# Patient Record
Sex: Female | Born: 1995 | Race: Asian | Hispanic: No | Marital: Single | Smoking: Former smoker
Health system: Southern US, Community
[De-identification: ages and names within clinical notes are randomized; demographics above are authoritative.]

## PROBLEM LIST (undated history)

## (undated) DIAGNOSIS — Z789 Other specified health status: Secondary | ICD-10-CM

## (undated) DIAGNOSIS — R002 Palpitations: Secondary | ICD-10-CM

## (undated) HISTORY — PX: NO PAST SURGERIES: SHX2092

## (undated) HISTORY — PX: TUBAL LIGATION: SHX77

## (undated) HISTORY — DX: Palpitations: R00.2

---
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
## 2014-05-11 ENCOUNTER — Emergency Department (HOSPITAL_COMMUNITY): Payer: No Typology Code available for payment source

## 2014-05-11 ENCOUNTER — Emergency Department (HOSPITAL_COMMUNITY)
Admission: EM | Admit: 2014-05-11 | Discharge: 2014-05-11 | Disposition: A | Discharge status: Home / Self Care | Payer: No Typology Code available for payment source | Attending: Emergency Medicine | Admitting: Emergency Medicine

## 2014-05-11 ENCOUNTER — Encounter (HOSPITAL_COMMUNITY): Payer: Self-pay | Admitting: Emergency Medicine

## 2014-05-11 DIAGNOSIS — S1091XA Abrasion of unspecified part of neck, initial encounter: Secondary | ICD-10-CM | POA: Diagnosis not present

## 2014-05-11 DIAGNOSIS — Y92410 Unspecified street and highway as the place of occurrence of the external cause: Secondary | ICD-10-CM | POA: Diagnosis not present

## 2014-05-11 DIAGNOSIS — R52 Pain, unspecified: Secondary | ICD-10-CM

## 2014-05-11 DIAGNOSIS — Z720 Tobacco use: Secondary | ICD-10-CM | POA: Insufficient documentation

## 2014-05-11 DIAGNOSIS — S161XXA Strain of muscle, fascia and tendon at neck level, initial encounter: Secondary | ICD-10-CM | POA: Diagnosis not present

## 2014-05-11 DIAGNOSIS — Y998 Other external cause status: Secondary | ICD-10-CM | POA: Insufficient documentation

## 2014-05-11 DIAGNOSIS — S3992XA Unspecified injury of lower back, initial encounter: Secondary | ICD-10-CM | POA: Insufficient documentation

## 2014-05-11 DIAGNOSIS — S20211A Contusion of right front wall of thorax, initial encounter: Secondary | ICD-10-CM | POA: Diagnosis not present

## 2014-05-11 DIAGNOSIS — S199XXA Unspecified injury of neck, initial encounter: Secondary | ICD-10-CM | POA: Diagnosis present

## 2014-05-11 DIAGNOSIS — Y9389 Activity, other specified: Secondary | ICD-10-CM | POA: Insufficient documentation

## 2014-05-11 MED ORDER — IBUPROFEN 600 MG PO TABS: 600.0000 mg | ORAL_TABLET | Freq: Four times a day (QID) | ORAL | Status: DC | PRN

## 2014-05-11 MED ORDER — IBUPROFEN 200 MG PO TABS
600.0000 mg | ORAL_TABLET | Freq: Once | ORAL | Status: AC
  Administered 2014-05-11: 600 mg via ORAL
  Filled 2014-05-11: qty 3

## 2014-05-11 NOTE — ED Notes (Signed)
Patient transported to X-ray 
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
## 2014-05-11 NOTE — ED Notes (Signed)
Patient transported to CT 

## 2014-05-11 NOTE — Discharge Instructions (Signed)
Cervical Sprain A cervical sprain is when the tissues (ligaments) that hold the neck bones in place stretch or tear. HOME CARE   Put ice on the injured area.  Put ice in a plastic bag.  Place a towel between your skin and the bag.  Leave the ice on for 15-20 minutes, 3-4 times a day.  You may have been given a collar to wear. This collar keeps your neck from moving while you heal.  Do not take the collar off unless told by your doctor.  If you have long hair, keep it outside of the collar.  Ask your doctor before changing the position of your collar. You may need to change its position over time to make it more comfortable.  If you are allowed to take off the collar for cleaning or bathing, follow your doctor's instructions on how to do it safely.  Keep your collar clean by wiping it with mild soap and water. Dry it completely. If the collar has removable pads, remove them every 1-2 days to hand wash them with soap and water. Allow them to air dry. They should be dry before you wear them in the collar.  Do not drive while wearing the collar.  Only take medicine as told by your doctor.  Keep all doctor visits as told.  Keep all physical therapy visits as told.  Adjust your work station so that you have good posture while you work.  Avoid positions and activities that make your problems worse.  Warm up and stretch before being active. GET HELP IF:  Your pain is not controlled with medicine.  You cannot take less pain medicine over time as planned.  Your activity level does not improve as expected. GET HELP RIGHT AWAY IF:   You are bleeding.  Your stomach is upset.  You have an allergic reaction to your medicine.  You develop new problems that you cannot explain.  You lose feeling (become numb) or you cannot move any part of your body (paralysis).  You have tingling or weakness in any part of your body.  Your symptoms get worse. Symptoms include:  Pain,  soreness, stiffness, puffiness (swelling), or a burning feeling in your neck.  Pain when your neck is touched.  Shoulder or upper back pain.  Limited ability to move your neck.  Headache.  Dizziness.  Your hands or arms feel week, lose feeling, or tingle.  Muscle spasms.  Difficulty swallowing or chewing. MAKE SURE YOU:   Understand these instructions.  Will watch your condition.  Will get help right away if you are not doing well or get worse. Document Released: 11/14/2007 Document Revised: 01/28/2013 Document Reviewed: 12/03/2012 [employer] Patient Information 2015 [employer]. This information is not intended to replace advice given to you by your health care provider. Make sure you discuss any questions you have with your health care provider.  
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
## 2016-03-15 LAB — OB RESULTS CONSOLE ABO/RH: RH Type: POSITIVE

## 2016-03-15 LAB — OB RESULTS CONSOLE GC/CHLAMYDIA
Chlamydia: NEGATIVE
GC PROBE AMP, GENITAL: NEGATIVE

## 2016-03-15 LAB — OB RESULTS CONSOLE HEPATITIS B SURFACE ANTIGEN: Hepatitis B Surface Ag: NEGATIVE

## 2016-03-15 LAB — OB RESULTS CONSOLE HIV ANTIBODY (ROUTINE TESTING): HIV: NONREACTIVE

## 2016-03-15 LAB — OB RESULTS CONSOLE RUBELLA ANTIBODY, IGM: RUBELLA: IMMUNE

## 2016-03-15 LAB — OB RESULTS CONSOLE GBS: STREP GROUP B AG: NEGATIVE
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
## 2016-07-23 LAB — OB RESULTS CONSOLE GBS: GBS: NEGATIVE

## 2016-08-10 ENCOUNTER — Emergency Department (HOSPITAL_COMMUNITY)
Admission: EM | Admit: 2016-08-10 | Discharge: 2016-08-10 | Disposition: A | Discharge status: Home / Self Care | Payer: Medicaid Other | Attending: Emergency Medicine | Admitting: Emergency Medicine

## 2016-08-10 ENCOUNTER — Encounter (HOSPITAL_COMMUNITY): Payer: Self-pay | Admitting: Emergency Medicine

## 2016-08-10 DIAGNOSIS — O99713 Diseases of the skin and subcutaneous tissue complicating pregnancy, third trimester: Secondary | ICD-10-CM | POA: Insufficient documentation

## 2016-08-10 DIAGNOSIS — Z87891 Personal history of nicotine dependence: Secondary | ICD-10-CM | POA: Diagnosis not present

## 2016-08-10 DIAGNOSIS — L299 Pruritus, unspecified: Secondary | ICD-10-CM | POA: Diagnosis not present

## 2016-08-10 DIAGNOSIS — Z3A39 39 weeks gestation of pregnancy: Secondary | ICD-10-CM | POA: Insufficient documentation

## 2016-08-10 LAB — POC URINE PREG, ED: PREG TEST UR: POSITIVE — AB

## 2016-08-10 LAB — URINALYSIS, ROUTINE W REFLEX MICROSCOPIC
Bacteria, UA: NONE SEEN
Bilirubin Urine: NEGATIVE
GLUCOSE, UA: NEGATIVE mg/dL
HGB URINE DIPSTICK: NEGATIVE
Ketones, ur: NEGATIVE mg/dL
Nitrite: NEGATIVE
Protein, ur: NEGATIVE mg/dL
SPECIFIC GRAVITY, URINE: 1.011 (ref 1.005–1.030)
pH: 7 (ref 5.0–8.0)

## 2016-08-10 LAB — CBC WITH DIFFERENTIAL/PLATELET
BASOS PCT: 0 %
Basophils Absolute: 0 10*3/uL (ref 0.0–0.1)
EOS ABS: 0.3 10*3/uL (ref 0.0–0.7)
Eosinophils Relative: 3 %
HCT: 31.6 % — ABNORMAL LOW (ref 36.0–46.0)
Hemoglobin: 10.4 g/dL — ABNORMAL LOW (ref 12.0–15.0)
LYMPHS ABS: 2.4 10*3/uL (ref 0.7–4.0)
Lymphocytes Relative: 24 %
MCH: 24.4 pg — AB (ref 26.0–34.0)
MCHC: 32.9 g/dL (ref 30.0–36.0)
MCV: 74 fL — ABNORMAL LOW (ref 78.0–100.0)
MONO ABS: 0.9 10*3/uL (ref 0.1–1.0)
MONOS PCT: 8 %
Neutro Abs: 6.7 10*3/uL (ref 1.7–7.7)
Neutrophils Relative %: 65 %
Platelets: 327 10*3/uL (ref 150–400)
RBC: 4.27 MIL/uL (ref 3.87–5.11)
RDW: 14.4 % (ref 11.5–15.5)
WBC: 10.2 10*3/uL (ref 4.0–10.5)

## 2016-08-10 LAB — COMPREHENSIVE METABOLIC PANEL
ALT: 7 U/L — AB (ref 14–54)
ANION GAP: 11 (ref 5–15)
AST: 16 U/L (ref 15–41)
Albumin: 2.8 g/dL — ABNORMAL LOW (ref 3.5–5.0)
Alkaline Phosphatase: 141 U/L — ABNORMAL HIGH (ref 38–126)
BUN: 9 mg/dL (ref 6–20)
CALCIUM: 9.2 mg/dL (ref 8.9–10.3)
CHLORIDE: 102 mmol/L (ref 101–111)
CO2: 23 mmol/L (ref 22–32)
CREATININE: 0.67 mg/dL (ref 0.44–1.00)
GFR calc Af Amer: >60 mL/min (ref >60)
Glucose, Bld: 96 mg/dL (ref 65–99)
Potassium: 3.9 mmol/L (ref 3.5–5.1)
Sodium: 136 mmol/L (ref 135–145)
Total Bilirubin: 0.4 mg/dL (ref 0.3–1.2)
Total Protein: 6.2 g/dL — ABNORMAL LOW (ref 6.5–8.1)

## 2016-08-10 MED ORDER — DIPHENHYDRAMINE HCL 25 MG PO TABS: 25.0000 mg | ORAL_TABLET | Freq: Four times a day (QID) | ORAL | 0 refills | Status: DC

## 2016-08-10 NOTE — Discharge Instructions (Signed)
The rash and itching on your hands and feet is most likely due to a virus. We have done lab work tonight to be sure there is no other problem. Some of the test will not be back for 24 hours. Follow up with your doctor. Have them check the labs that we did. You can also find the results in "My chart". If the results are abnormal someone will call you.  Take the Benadryl as needed for itching.

## 2016-08-10 NOTE — ED Triage Notes (Signed)
Pt presents to ED for assessment of rash to hands and feet, intermittent for "some time".  Pt states hot water makes it worse.  Pt states worse at night.  Itchy, sometimes painful.  Shows up between toes on feet, and on thumb and palms on hand.

## 2016-08-10 NOTE — ED Provider Notes (Signed)
[facility]-EMERGENCY DEPT Provider Note    By signing my name below, I, Earmon Phoenix, attest that this documentation has been prepared under the direction and in the presence of [facility], [place]. Electronically Signed: Earmon Phoenix, ED Scribe. 08/10/16. 10:55 PM.    History   Chief Complaint Chief Complaint  Patient presents with  . Rash   The history is provided by the patient and medical records. No language interpreter was used.    Cassandra Pacheco is a 21 y.o. female at [redacted] weeks gestation who presents to the Emergency Department complaining of itching that started more than a week ago. She reports the itching is between her fingers, on the palms of her hands and on the bottom of her feet. She reports having a rash that comes and goes on her hands. She has not done anything to treat the symptoms. There are no modifying factors noted. She denies mouth lesions, otalgia, fever, chills, nausea, vomiting, sore throat, difficulty breathing or swallowing, congestion. She denies any contractions today. She does not know the name of her OB but states she receives prenatal care at [facility].   History reviewed. No pertinent past medical history.  There are no active problems to display for this patient.   History reviewed. No pertinent surgical history.  OB History    Gravida Para Term Preterm AB Living   1             SAB TAB Ectopic Multiple Live Births                   Home Medications    Prior to Admission medications   Medication Sig Start Date End Date Taking? Authorizing Provider  Prenatal Vit-Fe Phos-FA-Omega (VITAFOL GUMMIES) 3.33-0.333-34.8 MG CHEW Chew 3 tablets by mouth daily. 06/20/16  Yes Historical Provider, MD  diphenhydrAMINE (BENADRYL) 25 MG tablet Take 1 tablet (25 mg total) by mouth every 6 (six) hours. 08/10/16   Liona Wengert Orlene Och, NP    Family History Family History  Problem Relation Age of Onset  . Hypertension Other   . Diabetes  Other     Social History Social History  Substance Use Topics  . Smoking status: Former Smoker    Packs/day: 0.25  . Smokeless tobacco: Never Used  . Alcohol use Yes     Allergies   Patient has no known allergies.   Review of Systems Review of Systems  Constitutional: Negative for chills and fever.  HENT: Negative for congestion, ear pain, mouth sores, sore throat and trouble swallowing.   Eyes: Negative for visual disturbance.  Respiratory: Negative for cough and shortness of breath.   Cardiovascular: Negative for chest pain.  Gastrointestinal: Negative for abdominal pain, nausea and vomiting.       Gravid @ [redacted] weeks gestation.   Genitourinary: Negative for difficulty urinating, dysuria, flank pain, frequency, vaginal bleeding and vaginal discharge.  Musculoskeletal: Negative for back pain and gait problem.  Skin: Positive for rash.  Neurological: Negative for dizziness, weakness, light-headedness and headaches.  Psychiatric/Behavioral: Negative for confusion.     Physical Exam Updated Vital Signs BP 132/84 (BP Location: Left Arm)   Pulse 96   Temp 98.2 F (36.8 C) (Oral)   Resp 17   SpO2 100%   Physical Exam  Constitutional: She is oriented to person, place, and time. She appears well-developed and well-nourished.  HENT:  No mouth lesions.  Eyes: EOM are normal.  Neck: Neck supple.  Cardiovascular: Normal rate.  Pulmonary/Chest: Effort normal.  Abdominal: Soft. There is no tenderness.  Gravid abdomen c/w dates  Musculoskeletal: Normal range of motion.  Small raised red rash to palms of hands. No rash noted to feet but patient complains of itching.   Neurological: She is alert and oriented to person, place, and time.  Skin: Skin is warm and dry. Rash noted.  Itchy, red bumps on bilateral palms of hands and a few areas noted to soles of feet bilaterally.  Psychiatric: She has a normal mood and affect. Her behavior is normal.  Nursing note and vitals  reviewed.    ED Treatments / Results  DIAGNOSTIC STUDIES: Oxygen Saturation is 100% on RA, normal by my interpretation.   COORDINATION OF CARE: 7:41 PM- Will check fetal heart tones. Pt verbalizes understanding and agrees to plan.  7:49 PM- [facility] who advised NST and lab work to check for cholestasis.  7:59 PM- Spoke with rapid response OB nurse who is going to come assess patient.  8:12 PM- Placed pt on fetal monitor with fetal heart rate of 150.  Rapid response RN "Olegario MessierKathy" here to assess the patient.  Reactive tracing with occasional contraction. Patient not feeling the contractions that are visible on the monitor.  Dr. Vergie LivingPickens notified of tracing and discussed plan of care. Patient can be d/c home with Benadryl and f/u with her OB or return for worsening symptoms.  Medications - No data to display  Labs Radiology No results found.  Procedures Procedures (including critical care time) Labs, EFM, observation.  Medications Ordered in ED Medications - No data to display   Initial Impression / Assessment and Plan / ED Course  I have reviewed the triage vital signs and the nursing notes.  Pertinent lab results that were available during my care of the patient were reviewed by me and considered in my medical decision making (see chart for details).   Patient with rash and itching. Possible cholestasis but doubt due to normal liver enzymes.Bile acid testing will result in two days. Possible coxsackie, rash most likely viral in nature. Doubt PUPPS. No signs of secondary infection. Discharged with symptomatic treatment, Benadryl and Follow up with OB in 2-3 days. Return precautions discussed. Pt is safe for discharge at this time.  I personally performed the services described in this documentation, which was scribed in my presence. The recorded information has been reviewed and is accurate.  Final Clinical Impressions(s) / ED Diagnoses   Final  diagnoses:  Pregnancy pruritus, third trimester    New Prescriptions Discharge Medication List as of 08/10/2016 10:56 PM    START taking these medications   Details  diphenhydrAMINE (BENADRYL) 25 MG tablet Take 1 tablet (25 mg total) by mouth every 6 (six) hours., Starting Fri 08/10/2016, [address]         Dawnn Nam [place] Keane Martelli, NP 08/11/16 [id number]    Mancel BaleElliott Wentz, MD 08/12/16 [phone]

## 2016-08-13 LAB — BILE ACIDS, TOTAL: Bile Acids Total: 9.1 umol/L (ref 4.7–24.5)

## 2016-08-17 ENCOUNTER — Inpatient Hospital Stay (HOSPITAL_COMMUNITY): Payer: Medicaid Other | Admitting: Anesthesiology

## 2016-08-17 ENCOUNTER — Inpatient Hospital Stay (HOSPITAL_COMMUNITY)
Admission: EM | Admit: 2016-08-17 | Discharge: 2016-08-20 | DRG: 775 | Disposition: A | Discharge status: Home / Self Care | Payer: Medicaid Other | Source: Ambulatory Visit | Attending: Obstetrics & Gynecology | Admitting: Obstetrics & Gynecology

## 2016-08-17 ENCOUNTER — Encounter (HOSPITAL_COMMUNITY): Payer: Self-pay

## 2016-08-17 DIAGNOSIS — Z87891 Personal history of nicotine dependence: Secondary | ICD-10-CM

## 2016-08-17 DIAGNOSIS — O99324 Drug use complicating childbirth: Secondary | ICD-10-CM | POA: Diagnosis present

## 2016-08-17 DIAGNOSIS — O4202 Full-term premature rupture of membranes, onset of labor within 24 hours of rupture: Principal | ICD-10-CM | POA: Diagnosis present

## 2016-08-17 DIAGNOSIS — Z6831 Body mass index (BMI) 31.0-31.9, adult: Secondary | ICD-10-CM

## 2016-08-17 DIAGNOSIS — Z3A40 40 weeks gestation of pregnancy: Secondary | ICD-10-CM | POA: Diagnosis not present

## 2016-08-17 DIAGNOSIS — Z8249 Family history of ischemic heart disease and other diseases of the circulatory system: Secondary | ICD-10-CM

## 2016-08-17 DIAGNOSIS — F1290 Cannabis use, unspecified, uncomplicated: Secondary | ICD-10-CM | POA: Diagnosis present

## 2016-08-17 DIAGNOSIS — Z3A39 39 weeks gestation of pregnancy: Secondary | ICD-10-CM

## 2016-08-17 DIAGNOSIS — Z3490 Encounter for supervision of normal pregnancy, unspecified, unspecified trimester: Secondary | ICD-10-CM

## 2016-08-17 DIAGNOSIS — E669 Obesity, unspecified: Secondary | ICD-10-CM | POA: Diagnosis present

## 2016-08-17 DIAGNOSIS — O99214 Obesity complicating childbirth: Secondary | ICD-10-CM | POA: Diagnosis present

## 2016-08-17 DIAGNOSIS — Z833 Family history of diabetes mellitus: Secondary | ICD-10-CM

## 2016-08-17 HISTORY — DX: Other specified health status: Z78.9

## 2016-08-17 LAB — CBC
HCT: 33.9 % — ABNORMAL LOW (ref 36.0–46.0)
Hemoglobin: 11.5 g/dL — ABNORMAL LOW (ref 12.0–15.0)
MCH: 25 pg — ABNORMAL LOW (ref 26.0–34.0)
MCHC: 33.9 g/dL (ref 30.0–36.0)
MCV: 73.7 fL — ABNORMAL LOW (ref 78.0–100.0)
PLATELETS: 360 10*3/uL (ref 150–400)
RBC: 4.6 MIL/uL (ref 3.87–5.11)
RDW: 14.8 % (ref 11.5–15.5)
WBC: 12.2 10*3/uL — AB (ref 4.0–10.5)

## 2016-08-17 LAB — POCT FERN TEST: POCT FERN TEST: POSITIVE

## 2016-08-17 LAB — TYPE AND SCREEN
ABO/RH(D): O POS
Antibody Screen: NEGATIVE

## 2016-08-17 MED ORDER — DIPHENHYDRAMINE HCL 50 MG/ML IJ SOLN: 12.5000 mg | INTRAMUSCULAR | Status: DC | PRN

## 2016-08-17 MED ORDER — LACTATED RINGERS IV SOLN
500.0000 mL | Freq: Once | INTRAVENOUS | Status: AC
  Administered 2016-08-17: 500 mL via INTRAVENOUS

## 2016-08-17 MED ORDER — TERBUTALINE SULFATE 1 MG/ML IJ SOLN
0.2500 mg | Freq: Once | INTRAMUSCULAR | Status: DC | PRN
Start: 2016-08-17 — End: 2016-08-18
  Filled 2016-08-17: qty 1

## 2016-08-17 MED ORDER — LIDOCAINE HCL (PF) 1 % IJ SOLN
30.0000 mL | INTRAMUSCULAR | Status: DC | PRN
  Filled 2016-08-17: qty 30

## 2016-08-17 MED ORDER — OXYTOCIN BOLUS FROM INFUSION
500.0000 mL | Freq: Once | INTRAVENOUS | Status: AC
  Administered 2016-08-18: 500 mL via INTRAVENOUS

## 2016-08-17 MED ORDER — ACETAMINOPHEN 325 MG PO TABS: 650.0000 mg | ORAL_TABLET | ORAL | Status: DC | PRN

## 2016-08-17 MED ORDER — EPHEDRINE 5 MG/ML INJ
10.0000 mg | INTRAVENOUS | Status: DC | PRN
  Filled 2016-08-17: qty 4

## 2016-08-17 MED ORDER — OXYTOCIN 40 UNITS IN LACTATED RINGERS INFUSION - SIMPLE MED
2.5000 [IU]/h | INTRAVENOUS | Status: DC
  Administered 2016-08-18: 2.5 [IU]/h via INTRAVENOUS

## 2016-08-17 MED ORDER — PHENYLEPHRINE 40 MCG/ML (10ML) SYRINGE FOR IV PUSH (FOR BLOOD PRESSURE SUPPORT)
80.0000 ug | PREFILLED_SYRINGE | INTRAVENOUS | Status: DC | PRN
  Filled 2016-08-17: qty 5
  Filled 2016-08-17: qty 10

## 2016-08-17 MED ORDER — LIDOCAINE HCL (PF) 1 % IJ SOLN
INTRAMUSCULAR | Status: DC | PRN
  Administered 2016-08-17: 4 mL via EPIDURAL
  Administered 2016-08-17: 3 mL via EPIDURAL

## 2016-08-17 MED ORDER — FENTANYL 2.5 MCG/ML BUPIVACAINE 1/10 % EPIDURAL INFUSION (WH - ANES)
14.0000 mL/h | INTRAMUSCULAR | Status: DC | PRN
  Administered 2016-08-17: 11 mL/h via EPIDURAL
  Administered 2016-08-18: 14 mL/h via EPIDURAL
  Filled 2016-08-17 (×2): qty 100

## 2016-08-17 MED ORDER — OXYTOCIN 40 UNITS IN LACTATED RINGERS INFUSION - SIMPLE MED
1.0000 m[IU]/min | INTRAVENOUS | Status: DC
  Administered 2016-08-17: 2 m[IU]/min via INTRAVENOUS
  Administered 2016-08-18: 10 m[IU]/min via INTRAVENOUS
  Filled 2016-08-17: qty 1000

## 2016-08-17 MED ORDER — ONDANSETRON HCL 4 MG/2ML IJ SOLN
4.0000 mg | Freq: Four times a day (QID) | INTRAMUSCULAR | Status: DC | PRN
  Administered 2016-08-18: 4 mg via INTRAVENOUS
  Filled 2016-08-17: qty 2

## 2016-08-17 MED ORDER — LACTATED RINGERS IV SOLN
INTRAVENOUS | Status: DC
  Administered 2016-08-17: 20:00:00 via INTRAVENOUS

## 2016-08-17 MED ORDER — OXYCODONE-ACETAMINOPHEN 5-325 MG PO TABS: 1.0000 | ORAL_TABLET | ORAL | Status: DC | PRN

## 2016-08-17 MED ORDER — SOD CITRATE-CITRIC ACID 500-334 MG/5ML PO SOLN: 30.0000 mL | ORAL | Status: DC | PRN

## 2016-08-17 MED ORDER — OXYCODONE-ACETAMINOPHEN 5-325 MG PO TABS: 2.0000 | ORAL_TABLET | ORAL | Status: DC | PRN

## 2016-08-17 MED ORDER — LACTATED RINGERS IV SOLN: 500.0000 mL | INTRAVENOUS | Status: DC | PRN

## 2016-08-17 NOTE — H&P (Signed)
Cassandra Pacheco is a 21 y.o. female G1P0 with IUP at 718w6d presenting for SROM/SOL. Pt states she has been having regular, every 3 minutes contractions, associated with none vaginal bleeding for 3 hours..  Membranes were initially intact, then SROM @ 1900 in MAU.active fetal movement.   [facility]  Prenatal History/Complications:  Past Medical History: Past Medical History:  Diagnosis Date  . Medical history non-contributory     Past Surgical History: Past Surgical History:  Procedure Laterality Date  . NO PAST SURGERIES      Obstetrical History: OB History    Gravida Para Term Preterm AB Living   1             SAB TAB Ectopic Multiple Live Births                   Social History: Social History   Social History  . Marital status: Single    Spouse name: N/A  . Number of children: N/A  . Years of education: N/A   Social History Main Topics  . Smoking status: Former Smoker    Packs/day: 0.25  . Smokeless tobacco: Never Used  . Alcohol use Yes  . Drug use: No  . Sexual activity: Yes    Birth control/ protection: None   Other Topics Concern  . None   Social History Narrative  . None    Family History: Family History  Problem Relation Age of Onset  . Hypertension Other   . Diabetes Other     Allergies: No Known Allergies  Prescriptions Prior to Admission  Medication Sig Dispense Refill Last Dose  . Prenatal Vit-Fe Phos-FA-Omega (VITAFOL GUMMIES) 3.33-0.333-34.8 MG CHEW Chew 3 tablets by mouth daily.  5 08/16/2016 at Unknown time    Review of Systems   Constitutional: Negative for fever and chills Eyes: Negative for visual disturbances Respiratory: Negative for shortness of breath, dyspnea Cardiovascular: Negative for chest pain or palpitations  Gastrointestinal: Negative for vomiting, diarrhea and constipation.  POSITIVE for abdominal pain (contractions) Genitourinary: Negative for dysuria and urgency Musculoskeletal: Negative for back  pain, joint pain, myalgias  Neurological: Negative for dizziness and headaches   Blood pressure 125/71, pulse 74, temperature 98.1 F (36.7 C), temperature source Oral, resp. rate 18, height 4\' 11"  (1.499 m), weight 71.7 kg (158 lb), last menstrual period 11/12/2015, SpO2 99 %. General appearance: alert, cooperative and no distress Lungs: clear to auscultation bilaterally Heart: regular rate and rhythm Abdomen: soft, non-tender; bowel sounds normal Extremities: Homans sign is negative, no sign of DVT DTR's 2+ Presentation: cephalic Fetal monitoring  Baseline: 130 bpm, Variability: Good {> 6 bpm), Accelerations: Reactive and Decelerations: Absent Uterine activity  Frequency: Every 2-3 minutes Dilation: 3.5 Effacement (%): 90 Station: -2 Exam by:: cwhite,rnc   Prenatal labs: ABO, Rh: --/--/O POS (03/09 1945) Antibody: NEG (03/09 1945) Rubella: immune (03/15/16) RPR:   Neg (03/15/16) HBsAg: Negative (10/05 0000)  HIV: Non-reactive (10/05 0000)  GBS: Negative (02/12 0000)  1 hr Glucola n/a   Prenatal Transfer Tool  Maternal Diabetes: No Genetic Screening: Normal Maternal Ultrasounds/Referrals: Normal Fetal Ultrasounds or other Referrals:  None Maternal Substance Abuse:  Yes:  Type: Marijuana Significant Maternal Medications:  None Significant Maternal Lab Results: Lab values include: Group B Strep negative     Results for orders placed or performed during the hospital encounter of 08/17/16 (from the past 24 hour(s))  Fern Test   Collection Time: 08/17/16  7:06 PM  Result Value Ref Range  POCT Fern Test Positive = ruptured amniotic membanes   CBC   Collection Time: 08/17/16  7:45 PM  Result Value Ref Range   WBC 12.2 (H) 4.0 - 10.5 K/uL   RBC 4.60 3.87 - 5.11 MIL/uL   Hemoglobin 11.5 (L) 12.0 - 15.0 g/dL   HCT 40.3 (L) 47.4 - 25.9 %   MCV 73.7 (L) 78.0 - 100.0 fL   MCH 25.0 (L) 26.0 - 34.0 pg   MCHC 33.9 30.0 - 36.0 g/dL   RDW 56.3 87.5 - 64.3 %   Platelets 360  150 - 400 K/uL  Type and screen [facility]   Collection Time: 08/17/16  7:45 PM  Result Value Ref Range   ABO/RH(D) O POS    Antibody Screen NEG    Sample Expiration 08/20/2016     Assessment: Cassandra Pacheco is a 21 y.o. G1P0 with an IUP at [redacted]w[redacted]d presenting for SROM/SOL   Plan: #Labor: expectant management #Pain:  Per request #FWB Cat 1 #ID: GBS: Neg #MOF:  Breast #MOC: Depo #Circ: undecided    Cassandra Pacheco 08/17/2016, 9:21 PM   CNM attestation:   Cassandra Pacheco is a 21 y.o. G1P0 here for initially a labor eval, and then had SROM in MAU @ 1900  PE: BP 109/70   Pulse 70   Temp 98.1 F (36.7 C) (Oral)   Resp 18   Ht 4\' 11"  (1.499 m)   Wt 71.7 kg (158 lb)   LMP 11/12/2015 (Exact Date)   SpO2 100%   BMI 31.91 kg/m  Gen: calm comfortable, NAD Resp: normal effort, no distress Abd: gravid  ROS, labs, PMH reviewed  Plan: Admit to [facility] Expectant management GBS neg Anticipate SVD  Cassandra Pacheco CNM 08/18/2016, 3:11 AM

## 2016-08-17 NOTE — Anesthesia Preprocedure Evaluation (Addendum)
Anesthesia Evaluation  Patient identified by MRN, date of birth, ID band Patient awake    Reviewed: Allergy & Precautions, NPO status , Patient's Chart, lab work & pertinent test results  Airway Mallampati: III  TM Distance: >3 FB Neck ROM: Full    Dental no notable dental hx. (+) Teeth Intact   Pulmonary neg pulmonary ROS, former smoker,    Pulmonary exam normal breath sounds clear to auscultation       Cardiovascular negative cardio ROS Normal cardiovascular exam Rhythm:Regular Rate:Normal     Neuro/Psych negative neurological ROS  negative psych ROS   GI/Hepatic negative GI ROS, Neg liver ROS,   Endo/Other  Obesity  Renal/GU negative Renal ROS  negative genitourinary   Musculoskeletal negative musculoskeletal ROS (+)   Abdominal (+) + obese,   Peds  Hematology  (+) anemia ,   Anesthesia Other Findings   Reproductive/Obstetrics (+) Pregnancy                            Anesthesia Physical Anesthesia Plan  ASA: II  Anesthesia Plan: Epidural   Post-op Pain Management:    Induction:   Airway Management Planned: Natural Airway  Additional Equipment:   Intra-op Plan:   Post-operative Plan:   Informed Consent: I have reviewed the patients History and Physical, chart, labs and discussed the procedure including the risks, benefits and alternatives for the proposed anesthesia with the patient or authorized representative who has indicated his/her understanding and acceptance.     Plan Discussed with: Anesthesiologist  Anesthesia Plan Comments:        Anesthesia Quick Evaluation

## 2016-08-17 NOTE — Anesthesia Pain Management Evaluation Note (Signed)
  CRNA Pain Management Visit Note  Patient: Lenon AhmadiVendela D Merten, 21 y.o., female  "Hello I am a member of the anesthesia team at [facility]. We have an anesthesia team available at all times to provide care throughout the hospital, including epidural management and anesthesia for C-section. I don't know your plan for the delivery whether it a natural birth, water birth, IV sedation, nitrous supplementation, doula or epidural, but we want to meet your pain goals."   1.Was your pain managed to your expectations on prior hospitalizations?   No prior hospitalizations  2.What is your expectation for pain management during this hospitalization?     Epidural  3.How can we help you reach that goal? Maintain epidural.  Record the patient's initial score and the patient's pain goal.   Pain: 1 ( prior to epidural it was a 10)  Pain Goal: 2 [facility] wants you to be able to say your pain was always managed very well.  Saxton Chain 08/17/2016

## 2016-08-17 NOTE — MAU Note (Signed)
Pt c/o contractions every 5 minutes since 11am today. Pt states baby is moving normally. Pt denies bleeding and leaking of fluid. Pt denies problems with this pregnancy.

## 2016-08-17 NOTE — Anesthesia Procedure Notes (Signed)
Epidural Patient location during procedure: OB Start time: 08/17/2016 9:10 PM  Staffing Anesthesiologist: Mal AmabileFOSTER, Olanrewaju Osborn Performed: anesthesiologist   Preanesthetic Checklist Completed: patient identified, site marked, surgical consent, pre-op evaluation, timeout performed, IV checked, risks and benefits discussed and monitors and equipment checked  Epidural Patient position: sitting Prep: site prepped and draped and DuraPrep Patient monitoring: continuous pulse ox and blood pressure Approach: midline Location: L3-L4 Injection technique: LOR air  Needle:  Needle type: Tuohy  Needle gauge: 17 G Needle length: 9 cm and 9 Needle insertion depth: 4 cm Catheter type: closed end flexible Catheter size: 19 Gauge Catheter at skin depth: 9 cm Test dose: negative and Other  Assessment Events: blood not aspirated, injection not painful, no injection resistance, negative IV test and no paresthesia  Additional Notes Patient identified. Risks and benefits discussed including failed block, incomplete  Pain control, post dural puncture headache, nerve damage, paralysis, blood pressure Changes, nausea, vomiting, reactions to medications-both toxic and allergic and post Partum back pain. All questions were answered. Patient expressed understanding and wished to proceed. Sterile technique was used throughout procedure. Epidural site was Dressed with sterile barrier dressing. No paresthesias, signs of intravascular injection Or signs of intrathecal spread were encountered.  Patient was more comfortable after the epidural was dosed. Please see RN's note for documentation of vital signs and FHR which are stable.

## 2016-08-18 ENCOUNTER — Encounter (HOSPITAL_COMMUNITY): Payer: Self-pay

## 2016-08-18 DIAGNOSIS — Z3A40 40 weeks gestation of pregnancy: Secondary | ICD-10-CM

## 2016-08-18 DIAGNOSIS — O4202 Full-term premature rupture of membranes, onset of labor within 24 hours of rupture: Secondary | ICD-10-CM

## 2016-08-18 LAB — ABO/RH: ABO/RH(D): O POS

## 2016-08-18 LAB — RPR: RPR: NONREACTIVE

## 2016-08-18 MED ORDER — ACETAMINOPHEN 325 MG PO TABS: 650.0000 mg | ORAL_TABLET | ORAL | Status: DC | PRN

## 2016-08-18 MED ORDER — WITCH HAZEL-GLYCERIN EX PADS: 1.0000 "application " | MEDICATED_PAD | CUTANEOUS | Status: DC | PRN

## 2016-08-18 MED ORDER — SENNOSIDES-DOCUSATE SODIUM 8.6-50 MG PO TABS
2.0000 | ORAL_TABLET | ORAL | Status: DC
  Administered 2016-08-19 (×2): 2 via ORAL
  Filled 2016-08-18 (×2): qty 2

## 2016-08-18 MED ORDER — DIBUCAINE 1 % RE OINT: 1.0000 "application " | TOPICAL_OINTMENT | RECTAL | Status: DC | PRN

## 2016-08-18 MED ORDER — BENZOCAINE-MENTHOL 20-0.5 % EX AERO
1.0000 "application " | INHALATION_SPRAY | CUTANEOUS | Status: DC | PRN
  Administered 2016-08-19: 1 via TOPICAL
  Filled 2016-08-18: qty 56

## 2016-08-18 MED ORDER — SIMETHICONE 80 MG PO CHEW: 80.0000 mg | CHEWABLE_TABLET | ORAL | Status: DC | PRN

## 2016-08-18 MED ORDER — ONDANSETRON HCL 4 MG PO TABS
4.0000 mg | ORAL_TABLET | ORAL | Status: DC | PRN
Start: 2016-08-18 — End: 2016-08-20

## 2016-08-18 MED ORDER — OXYCODONE HCL 5 MG PO TABS: 5.0000 mg | ORAL_TABLET | ORAL | Status: DC | PRN

## 2016-08-18 MED ORDER — COCONUT OIL OIL: 1.0000 "application " | TOPICAL_OIL | Status: DC | PRN

## 2016-08-18 MED ORDER — ONDANSETRON HCL 4 MG/2ML IJ SOLN
4.0000 mg | INTRAMUSCULAR | Status: DC | PRN
Start: 2016-08-18 — End: 2016-08-20

## 2016-08-18 MED ORDER — TETANUS-DIPHTH-ACELL PERTUSSIS 5-2.5-18.5 LF-MCG/0.5 IM SUSP: 0.5000 mL | Freq: Once | INTRAMUSCULAR | Status: DC

## 2016-08-18 MED ORDER — DIPHENHYDRAMINE HCL 25 MG PO CAPS: 25.0000 mg | ORAL_CAPSULE | Freq: Four times a day (QID) | ORAL | Status: DC | PRN

## 2016-08-18 MED ORDER — ZOLPIDEM TARTRATE 5 MG PO TABS: 5.0000 mg | ORAL_TABLET | Freq: Every evening | ORAL | Status: DC | PRN

## 2016-08-18 MED ORDER — PRENATAL MULTIVITAMIN CH
1.0000 | ORAL_TABLET | Freq: Every day | ORAL | Status: DC
  Administered 2016-08-18 – 2016-08-20 (×3): 1 via ORAL
  Filled 2016-08-18 (×3): qty 1

## 2016-08-18 MED ORDER — IBUPROFEN 600 MG PO TABS
600.0000 mg | ORAL_TABLET | Freq: Four times a day (QID) | ORAL | Status: DC
  Administered 2016-08-18 – 2016-08-20 (×9): 600 mg via ORAL
  Filled 2016-08-18 (×9): qty 1

## 2016-08-18 NOTE — Anesthesia Postprocedure Evaluation (Signed)
Anesthesia Post Note  Patient: Cassandra AhmadiVendela D Pacheco  Procedure(s) Performed: * No procedures listed *  Patient location during evaluation: Mother Baby Anesthesia Type: Epidural Level of consciousness: awake Pain management: pain level controlled Vital Signs Assessment: post-procedure vital signs reviewed and stable Respiratory status: spontaneous breathing Cardiovascular status: stable Postop Assessment: no headache, no backache, epidural receding, patient able to bend at knees, no signs of nausea or vomiting and adequate PO intake Anesthetic complications: no        Last Vitals:  Vitals:   08/18/16 1000 08/18/16 1041  BP: (!) 101/56 117/64  Pulse: 99 96  Resp: 18   Temp:  37.2 C    Last Pain:  Vitals:   08/18/16 0945  TempSrc: Axillary  PainSc:    Pain Goal: Patients Stated Pain Goal: 4 (08/17/16 2200)               Kathlene Yano

## 2016-08-18 NOTE — Progress Notes (Signed)
S: patient comfortable with epidural- not feeling any pressure in bottom   O: Dilation: 6.5 Effacement (%): 90 Cervical Position: Middle Station: -2 Presentation: Vertex Exam by:: MIndy Trogdon RN  FHR: baseline 130 moderate variability accelerations present early decelerations   A: Active labor with SROM      FHR cat 1   P: continue with pitocin titration   I have seen and examined this patient and I agree with the above. Cam HaiSHAW, KIMBERLY CNM 3:12 AM 08/18/2016

## 2016-08-18 NOTE — Progress Notes (Signed)
Patient comfortable with epidural   Dilation: 8 Effacement (%): 90 Cervical Position: Middle Station: -1 Presentation: Vertex Exam by:: Melburn PopperMichelle Williams, RN  Active labor  FHR cat 1    Expectant management  Continue with pitocin   I have seen and examined this patient and I agree with the above. Cassandra Pacheco 3:12 AM 08/18/2016

## 2016-08-18 NOTE — Clinical Social Work Maternal (Signed)
  CLINICAL SOCIAL WORK MATERNAL/CHILD NOTE  Patient Details  Name: Cassandra Pacheco MRN: [id number] Date of Birth: January 16, 1996  Date:  08/18/2016  Clinical Social Worker Initiating Note:  Ferdinand Lango Kendell Sagraves, MSW, LCSW-A  Date/ Time Initiated:  08/18/16/1302     Child's Name:  Cassandra Pacheco    Legal Guardian:  Other (Comment) (Not established by court system; MOB and FOB (Pacheco ) parent collectively )   Need for Interpreter:  None   Date of Referral:  08/18/16     Reason for Referral:  Current Substance Use/Substance Use During Pregnancy    Referral Source:  [facility]   Address:  [place] [address]  Phone number:  [phone]   Household Members:  Self, Significant Other   Natural Supports (not living in the home):  Friends, Immediate Family, Extended Family, Parent   Professional Supports: None   Employment: Unemployed   Type of Work: Unemployed    Education:  9 to 11 years   Museum/gallery curator Resources:  Medicaid   Other Resources:  Other (Comment) (None reported. )   Cultural/Religious Considerations Which May Impact Care:  None reported. Christian per face sheet.   Strengths:  Ability to meet basic needs , Compliance with medical plan , Home prepared for child    Risk Factors/Current Problems:  Substance Use    Cognitive State:  Alert , Able to Concentrate , Goal Oriented , Insightful    Mood/Affect:  Comfortable , Calm , Relaxed , Interested    CSW Assessment: CSW met with MOB at bedside to complete assessment for consult regarding hx of THC use. Upon this writers arrival, MOB was in bed changing baby's clothes with the assistance of FOB. With MOB's permission, this writer explained role and reasoning for visit. MOB was pleasant and welcoming of this writers arrival. This Probation officer inquired about MOB's substance use. MOB confirms a hx of substance. This Probation officer inquired if MOB would like substance use resources. MOB declined noting she uses  recreational. This Probation officer discussed two drug screens taken of baby and mandatory report making for positive results. MOB verbalized understanding. MOB noted she feels good since delivery and feels supported and prepared. MOB notes she does not have PCP picked out for child yet but would like to receive a list. This Probation officer informed MOB she will bring down a list of providers and clinical staff will help arrange appointment. MOB verbalized understanding. This Probation officer discussed PPD and safe sleeping/SIDS. MOB verbalized understanding. At this time no other needs addressed or requested thus, case closed to this CSW.   CSW Plan/Description:  No Further Intervention Required/No Barriers to Discharge, Patient/Family Education , Information/Referral to [employer] , Other (Comment) (CSW will continue to follow pending UDS and CDS results. Report will be made for positvie results. )    Ferdinand Lango Deaunte Dente, MSW, [facility]  Office: [phone]

## 2016-08-18 NOTE — Lactation Note (Signed)
This note was copied from a baby's chart. Lactation Consultation Note  Patient Name: Cassandra Celene SkeenVendela Pacheco NWGNF'AToday's Date: 08/18/2016 Reason for consult: Initial assessment Baby at 8 hr of life. Mom reports latching baby is hard. She denies breast or nipple pain. She desires to offer breast and formula bottles. She is an active WIC clt and plans to get DEBP from them at her next apt. Baby was sleeping at this visit. Mom declined latch help. Discussed baby behavior, feeding frequency, pumping, supplementing, artifical nipples, baby belly size, voids, wt loss, breast changes, and nipple care. She stated she can manually express and has spoon in room. Given lactation handouts. Aware of OP services and support group. She will offer the breast on demand, post express or offer formula per volume guidelines.     Maternal Data Has patient been taught Hand Expression?: Yes Does the patient have breastfeeding experience prior to this delivery?: No  Feeding    LATCH Score/Interventions                      Lactation Tools Discussed/Used WIC Program: Yes   Consult Status Consult Status: Follow-up Date: 08/19/16 Follow-up type: In-patient    Cassandra Pacheco 08/18/2016, 4:35 PM

## 2016-08-19 MED ORDER — IBUPROFEN 600 MG PO TABS: 600.0000 mg | ORAL_TABLET | Freq: Four times a day (QID) | ORAL | 0 refills | Status: AC

## 2016-08-19 MED ORDER — MEDROXYPROGESTERONE ACETATE 150 MG/ML IM SUSP
150.0000 mg | Freq: Once | INTRAMUSCULAR | Status: AC
  Administered 2016-08-20: 150 mg via INTRAMUSCULAR
  Filled 2016-08-19: qty 1

## 2016-08-19 NOTE — Lactation Note (Signed)
This note was copied from a baby's chart. Lactation Consultation Note  Patient Name: Cassandra Pacheco ZOXWR'UToday's Date: 08/19/2016 Reason for consult: Follow-up assessment Baby at 34 hr of life. Upon entry family was waking up. Offered to help latch baby and Mom declined. She stated that visitors are on the way to the room. She reports baby is latching better today. Some feedings baby will not latch at all so she offers a bottle, other feedings baby comes off the breast still hungry so she offers a bottle. Discussed baby behavior, feeding frequency, baby belly size, voids, wt loss, breast changes, and nipple care. Parents are aware of lactation services and support group.    Maternal Data    Feeding Feeding Type: Bottle Fed - Formula  LATCH Score/Interventions Latch:  (no latch witnessed)                    Lactation Tools Discussed/Used     Consult Status Consult Status: Follow-up Date: 08/20/16 Follow-up type: In-patient    Rulon Eisenmengerlizabeth E Ashe Gago 08/19/2016, 6:03 PM

## 2016-08-19 NOTE — Discharge Summary (Signed)
Obstetric Discharge Summary Reason for Admission: [redacted] weeks EGA, SOL Prenatal Procedures: ultrasound Intrapartum Procedures: spontaneous vaginal delivery Postpartum Procedures: none Complications-Operative and Postpartum: none Hemoglobin  Date Value Ref Range Status  08/17/2016 11.5 (L) 12.0 - 15.0 g/dL Final   HCT  Date Value Ref Range Status  08/17/2016 33.9 (L) 36.0 - 46.0 % Final    Physical Exam:  General: alert Lochia: appropriate Uterine Fundus: firm Incision: healing well DVT Evaluation: No evidence of DVT seen on physical exam.  Discharge Diagnoses: Term Pregnancy-delivered  Discharge Information: Date: 08/19/2016 Activity: pelvic rest Diet: routine Medications: PNV, Ibuprofen and depo provera prior to discharge home Condition: stable Instructions: refer to practice specific booklet Discharge to: home Follow-up Information    [facility] Schedule an appointment as soon as possible for a visit in 6 week(s).   Specialty:  Home Health Services Contact information: [address] [place] [address] [phone]           Newborn Data: Live born female  Birth Weight: 6 lb 14.6 oz (3135 g) APGAR: 7, 9  Home with mother pending peds rounding and approval for discharge.  Cassandra BossierMyra C Shacoya Pacheco 08/19/2016, 9:06 AM

## 2016-08-19 NOTE — Progress Notes (Signed)
Post Partum Day#1 Subjective: no complaints, up ad lib, voiding and tolerating PO  Objective: Blood pressure 103/61, pulse 79, temperature 98.1 F (36.7 C), resp. rate 18, height 4\' 11"  (1.499 m), weight 71.7 kg (158 lb), last menstrual period 11/12/2015, SpO2 100 %, unknown if currently breastfeeding.  Physical Exam:  General: alert and cooperative Lochia: appropriate Uterine Fundus: firm and NT at U-1 DVT Evaluation: No evidence of DVT seen on physical exam.   Recent Labs  08/17/16 1945  HGB 11.5*  HCT 33.9*    Assessment/Plan: Plan for discharge tomorrow   LOS: 2 days   Allie BossierMyra C Jadyn Brasher 08/19/2016, 8:54 AM

## 2016-08-19 NOTE — Discharge Instructions (Signed)

## 2016-08-20 NOTE — Discharge Summary (Signed)
Cassandra BossierMyra C Caitlyn Buchanan, MD  Obstetrics  Expand All Collapse All   [] Hide copied text [] Hover for attribution information Obstetric Discharge Summary Reason for Admission: [redacted] weeks EGA, SOL Prenatal Procedures: ultrasound Intrapartum Procedures: spontaneous vaginal delivery Postpartum Procedures: none Complications-Operative and Postpartum: none LastLabs       Hemoglobin  Date Value Ref Range Status  08/17/2016 11.5 (L) 12.0 - 15.0 g/dL Final        HCT  Date Value Ref Range Status  08/17/2016 33.9 (L) 36.0 - 46.0 % Final      Physical Exam:  General: alert Lochia: appropriate Uterine Fundus: firm Incision: healing well DVT Evaluation: No evidence of DVT seen on physical exam.  Discharge Diagnoses: Term Pregnancy-delivered  Discharge Information: Date: 08/19/2016 Activity: pelvic rest Diet: routine Medications: PNV, Ibuprofen and depo provera prior to discharge home Condition: stable Instructions: refer to practice specific booklet Discharge to: home    Follow-up Information    [facility]. Schedule an appointment as soon as possible for a visit in 6 week(s).   Specialty:  Home Health Services Contact information: [address] [place] [address] [phone]           Newborn Data: Live born female  Birth Weight: 6 lb 14.6 oz (3135 g) APGAR: 7, 9  Home with mother pending peds rounding and approval for discharge.

## 2016-08-20 NOTE — Plan of Care (Signed)
Problem: Coping: Goal: Ability to identify and utilize available resources and services will improve Outcome: Completed/Met Date Met: 08/20/16 Patient has been seen by Lactation Consultant. Patient is bottle feeding baby with formula all feedings. She has put baby to breast once in greater than 24 hours. Instructed mother to increase formula volume amounts if not putting baby to breast to feed or not pumping to give additional calories. Mother has guidelines for formula feeding amounts.

## 2016-08-20 NOTE — Lactation Note (Signed)
This note was copied from a baby's chart. Lactation Consultation Note Mom reports baby is very fussy at the breast. Reports she does latch well with no pain but doesn't stay on very long.  Reports she has just finished nursing for 3 min and then gave formula. Baby asleep in mom's arms.  Reviewed importance of frequent nursing to promote milk supply. Encouraged to latch baby every feeding then give formula if baby is still hungry.  Has WIC. Plans to use manual pump for now.  Reviewed engorgement prevention and treatment.  Reviewed our phone number, OP appointments and BFSG as resources for support after DC. No questions at present.  To call prn  Patient Name: Cassandra Pacheco [id number]'s Date: 08/20/2016 Reason for consult: Follow-up assessment   Maternal Data Formula Feeding for Exclusion: Yes Reason for exclusion: Mother's choice to formula and breast feed on admission Has patient been taught Hand Expression?: Yes Does the patient have breastfeeding experience prior to this delivery?: No  Feeding Feeding Type: Breast Fed Nipple Type: Slow - flow  LATCH Score/Interventions                      Lactation Tools Discussed/Used     Consult Status Consult Status: Complete    Pamelia HoitWeeks, Javarious Elsayed D 08/20/2016, 9:18 AM

## 2020-01-25 LAB — HM PAP SMEAR: HM Pap smear: NORMAL

## 2022-03-31 ENCOUNTER — Encounter (HOSPITAL_COMMUNITY): Payer: Self-pay | Admitting: Emergency Medicine

## 2022-03-31 ENCOUNTER — Emergency Department (HOSPITAL_COMMUNITY)
Admission: EM | Admit: 2022-03-31 | Discharge: 2022-03-31 | Discharge status: Against Medical Advice | Payer: Medicaid Other | Attending: Emergency Medicine | Admitting: Emergency Medicine

## 2022-03-31 ENCOUNTER — Other Ambulatory Visit: Payer: Self-pay

## 2022-03-31 ENCOUNTER — Emergency Department (HOSPITAL_COMMUNITY): Payer: Medicaid Other

## 2022-03-31 DIAGNOSIS — R002 Palpitations: Secondary | ICD-10-CM | POA: Diagnosis present

## 2022-03-31 DIAGNOSIS — Z5321 Procedure and treatment not carried out due to patient leaving prior to being seen by health care provider: Secondary | ICD-10-CM | POA: Diagnosis not present

## 2022-03-31 DIAGNOSIS — R7309 Other abnormal glucose: Secondary | ICD-10-CM | POA: Insufficient documentation

## 2022-03-31 LAB — BASIC METABOLIC PANEL
Anion gap: 8 (ref 5–15)
BUN: 10 mg/dL (ref 6–20)
CO2: 21 mmol/L — ABNORMAL LOW (ref 22–32)
Calcium: 9.1 mg/dL (ref 8.9–10.3)
Chloride: 110 mmol/L (ref 98–111)
Creatinine, Ser: 0.61 mg/dL (ref 0.44–1.00)
GFR, Estimated: >60 mL/min (ref >60)
Glucose, Bld: 106 mg/dL — ABNORMAL HIGH (ref 70–99)
Potassium: 4 mmol/L (ref 3.5–5.1)
Sodium: 139 mmol/L (ref 135–145)

## 2022-03-31 LAB — CBC WITH DIFFERENTIAL/PLATELET
Abs Immature Granulocytes: 0.02 10*3/uL (ref 0.00–0.07)
Basophils Absolute: 0 10*3/uL (ref 0.0–0.1)
Basophils Relative: 0 %
Eosinophils Absolute: 1.5 10*3/uL — ABNORMAL HIGH (ref 0.0–0.5)
Eosinophils Relative: 16 %
HCT: 39.3 % (ref 36.0–46.0)
Hemoglobin: 12.5 g/dL (ref 12.0–15.0)
Immature Granulocytes: 0 %
Lymphocytes Relative: 31 %
Lymphs Abs: 2.8 10*3/uL (ref 0.7–4.0)
MCH: 24.2 pg — ABNORMAL LOW (ref 26.0–34.0)
MCHC: 31.8 g/dL (ref 30.0–36.0)
MCV: 76 fL — ABNORMAL LOW (ref 80.0–100.0)
Monocytes Absolute: 0.5 10*3/uL (ref 0.1–1.0)
Monocytes Relative: 6 %
Neutro Abs: 4.4 10*3/uL (ref 1.7–7.7)
Neutrophils Relative %: 47 %
Platelets: 340 10*3/uL (ref 150–400)
RBC: 5.17 MIL/uL — ABNORMAL HIGH (ref 3.87–5.11)
RDW: 14.7 % (ref 11.5–15.5)
WBC: 9.3 10*3/uL (ref 4.0–10.5)
nRBC: 0 % (ref 0.0–0.2)

## 2022-03-31 LAB — I-STAT BETA HCG BLOOD, ED (MC, WL, AP ONLY): I-stat hCG, quantitative: <5 m[IU]/mL (ref <5)

## 2022-03-31 LAB — CBG MONITORING, ED: Glucose-Capillary: 110 mg/dL — ABNORMAL HIGH (ref 70–99)

## 2022-03-31 NOTE — ED Notes (Addendum)
, °

## 2022-03-31 NOTE — ED Provider Triage Note (Signed)
  Emergency Medicine Provider Triage Evaluation Note  MRN:  [id number]  Arrival date & time: 03/31/22    Medically screening exam initiated at 6:07 AM.   CC:   Palpitations   HPI:  Cassandra Pacheco is a 26 y.o. year-old female presents to the ED with chief complaint of intermittent palpitations for a while.  Worsened today.  Denies stimulant use.  Denies any medications.  History provided by patient. ROS:  -As included in HPI PE:   Vitals:   03/31/22 0603  BP: (!) 132/97  Pulse: 86  Resp: 16  Temp: 98.2 F (36.8 C)  SpO2: 100%    Non-toxic appearing No respiratory distress  MDM:  Based on signs and symptoms, anxiety is highest on my differential, followed by dysrhythmia. I've ordered labs and imaging in triage to expedite lab/diagnostic workup.  Patient was informed that the remainder of the evaluation will be completed by another provider, this initial triage assessment does not replace that evaluation, and the importance of remaining in the ED until their evaluation is complete.    Montine Circle, PA-C 03/31/22 [phone]

## 2022-03-31 NOTE — ED Notes (Signed)
Called pt no answer x2 

## 2022-03-31 NOTE — ED Triage Notes (Signed)
Pt c/o palpitations, reports palpitations becoming more frequent. Denies other symptoms at this time.

## 2022-03-31 NOTE — ED Notes (Signed)
Called pt no answer °

## 2022-03-31 NOTE — ED Notes (Signed)
Called no answer x1

## 2022-08-14 ENCOUNTER — Other Ambulatory Visit: Payer: Self-pay

## 2022-08-14 ENCOUNTER — Emergency Department (HOSPITAL_COMMUNITY)
Admission: EM | Admit: 2022-08-14 | Discharge: 2022-08-14 | Disposition: A | Discharge status: Home / Self Care | Payer: Medicaid Other | Attending: Emergency Medicine | Admitting: Emergency Medicine

## 2022-08-14 DIAGNOSIS — I493 Ventricular premature depolarization: Secondary | ICD-10-CM | POA: Diagnosis not present

## 2022-08-14 DIAGNOSIS — R002 Palpitations: Secondary | ICD-10-CM

## 2022-08-14 LAB — MAGNESIUM: Magnesium: 2 mg/dL (ref 1.7–2.4)

## 2022-08-14 LAB — BASIC METABOLIC PANEL
Anion gap: 7 (ref 5–15)
BUN: 10 mg/dL (ref 6–20)
CO2: 23 mmol/L (ref 22–32)
Calcium: 9.1 mg/dL (ref 8.9–10.3)
Chloride: 107 mmol/L (ref 98–111)
Creatinine, Ser: 0.67 mg/dL (ref 0.44–1.00)
GFR, Estimated: >60 mL/min (ref >60)
Glucose, Bld: 102 mg/dL — ABNORMAL HIGH (ref 70–99)
Potassium: 3.9 mmol/L (ref 3.5–5.1)
Sodium: 137 mmol/L (ref 135–145)

## 2022-08-14 LAB — URINALYSIS, ROUTINE W REFLEX MICROSCOPIC
Bilirubin Urine: NEGATIVE
Glucose, UA: NEGATIVE mg/dL
Hgb urine dipstick: NEGATIVE
Ketones, ur: NEGATIVE mg/dL
Leukocytes,Ua: NEGATIVE
Nitrite: NEGATIVE
Protein, ur: NEGATIVE mg/dL
Specific Gravity, Urine: 1.005 (ref 1.005–1.030)
pH: 5 (ref 5.0–8.0)

## 2022-08-14 LAB — CBC
HCT: 39 % (ref 36.0–46.0)
Hemoglobin: 12.3 g/dL (ref 12.0–15.0)
MCH: 23.7 pg — ABNORMAL LOW (ref 26.0–34.0)
MCHC: 31.5 g/dL (ref 30.0–36.0)
MCV: 75.1 fL — ABNORMAL LOW (ref 80.0–100.0)
Platelets: 343 10*3/uL (ref 150–400)
RBC: 5.19 MIL/uL — ABNORMAL HIGH (ref 3.87–5.11)
RDW: 14.4 % (ref 11.5–15.5)
WBC: 8.3 10*3/uL (ref 4.0–10.5)
nRBC: 0 % (ref 0.0–0.2)

## 2022-08-14 LAB — TSH: TSH: 1.727 u[IU]/mL (ref 0.350–4.500)

## 2022-08-14 LAB — RAPID URINE DRUG SCREEN, HOSP PERFORMED
Amphetamines: NOT DETECTED
Barbiturates: NOT DETECTED
Benzodiazepines: NOT DETECTED
Cocaine: NOT DETECTED
Opiates: NOT DETECTED
Tetrahydrocannabinol: NOT DETECTED

## 2022-08-14 LAB — I-STAT BETA HCG BLOOD, ED (MC, WL, AP ONLY): I-stat hCG, quantitative: <5 m[IU]/mL (ref <5)

## 2022-08-14 LAB — CBG MONITORING, ED: Glucose-Capillary: 93 mg/dL (ref 70–99)

## 2022-08-14 NOTE — Discharge Instructions (Signed)
You were seen in the emergency department for your heart palpitations.  It appeared on the monitor that you are occasionally having extra beats called PVCs which are generally benign but sometimes people can feel them.  Your workup showed normal electrolytes, you are not anemic and your thyroid function were normal.  It is unclear what is causing your PVCs but continuing to decrease your alcohol consumption and refraining from caffeine will help prevent the PVCs.  You can follow-up with your primary doctor to have your symptoms rechecked and occasionally if they are recurrent you may be started on medications.  You should return to the emergency department if you feel like your heart is racing and it does not go away on its own, you have severe chest pains, you pass out or if you have any other new or concerning symptoms.

## 2022-08-14 NOTE — ED Triage Notes (Signed)
Pt stated, I was having my heart flutter on and off for 3 days with dizziness. My last episode was in January, I did come here but didn't wait cause wait too long.. I have an appt with my Dr but not til March 20.

## 2022-08-14 NOTE — ED Provider Notes (Signed)
[facility] Provider Note   CSN: [id number] Arrival date & time: 08/14/22  0741     History  Chief Complaint  Patient presents with   Dizziness   heart flutter    Cassandra Pacheco is a 27 y.o. female.  Patient is a 27 year old female with no significant past medical history presenting to the emergency department with palpitations.  The patient states over the last 3 days she has felt her heart fluttering or like it is skipping beats.  She states that she tends to wake up with the feeling in the morning and will last several hours into the afternoon before it resolves.  She states that she felt lightheaded yesterday at work while having the palpitations.  She denies any associated chest pain or shortness of breath.  She denies any recent nausea, vomiting or diarrhea or recent fevers or illnesses.  She denies significant caffeine use and states that she has been cutting back on alcohol over the last month.  She denies any tobacco or drug use.  The history is provided by the patient.  Dizziness      Home Medications Prior to Admission medications   Medication Sig Start Date End Date Taking? Authorizing Provider  ibuprofen (ADVIL,MOTRIN) 600 MG tablet Take 1 tablet (600 mg total) by mouth every 6 (six) hours. 08/19/16   Emily Filbert, MD  Prenatal Vit-Fe Phos-FA-Omega (VITAFOL GUMMIES) 3.33-0.333-34.8 MG CHEW Chew 3 each by mouth daily.     [provider]      Allergies    Patient has no known allergies.    Review of Systems   Review of Systems  Neurological:  Positive for dizziness.    Physical Exam Updated Vital Signs BP 101/69   Pulse (!) 58   Temp 98.6 F (37 C) (Oral)   Resp 15   Ht '4\' 11"'$  (1.499 m)   LMP 07/22/2022   SpO2 100%   BMI 31.91 kg/m  Physical Exam Vitals and nursing note reviewed.  Constitutional:      General: She is not in acute distress.    Appearance: Normal appearance.  HENT:     Head:  Normocephalic and atraumatic.     Nose: Nose normal.     Mouth/Throat:     Mouth: Mucous membranes are moist.     Pharynx: Oropharynx is clear.  Eyes:     Extraocular Movements: Extraocular movements intact.     Conjunctiva/sclera: Conjunctivae normal.  Cardiovascular:     Rate and Rhythm: Normal rate and regular rhythm.     Pulses: Normal pulses.     Heart sounds: Normal heart sounds.  Pulmonary:     Effort: Pulmonary effort is normal.     Breath sounds: Normal breath sounds.  Abdominal:     General: Abdomen is flat.     Palpations: Abdomen is soft.     Tenderness: There is no abdominal tenderness.  Musculoskeletal:        General: Normal range of motion.     Cervical back: Normal range of motion and neck supple.     Right lower leg: No edema.     Left lower leg: No edema.  Skin:    General: Skin is warm and dry.  Neurological:     General: No focal deficit present.     Mental Status: She is alert and oriented to person, place, and time.  Psychiatric:        Mood and Affect:  Mood normal.        Behavior: Behavior normal.     ED Results / Procedures / Treatments   Labs (all labs ordered are listed, but only abnormal results are displayed) Labs Reviewed  BASIC METABOLIC PANEL - Abnormal; Notable for the following components:      Result Value   Glucose, Bld 102 (*)    All other components within normal limits  CBC - Abnormal; Notable for the following components:   RBC 5.19 (*)    MCV 75.1 (*)    MCH 23.7 (*)    All other components within normal limits  URINALYSIS, ROUTINE W REFLEX MICROSCOPIC - Abnormal; Notable for the following components:   Color, Urine STRAW (*)    All other components within normal limits  MAGNESIUM  TSH  RAPID URINE DRUG SCREEN, HOSP PERFORMED  CBG MONITORING, ED  I-STAT BETA HCG BLOOD, ED ([facility], [facility], [facility] ONLY)    EKG EKG Interpretation  Date/Time:  Tuesday August 14 2022 07:50:47 EST Ventricular Rate:  67 PR Interval:  160 QRS  Duration: 93 QT Interval:  392 QTC Calculation: 414 R Axis:   52 Text Interpretation: Sinus rhythm No significant change since last tracing Confirmed by Leanord Asal ([id number]) on 08/14/2022 7:56:06 AM  Radiology No results found.  Procedures Procedures    Medications Ordered in ED Medications - No data to display  ED Course/ Medical Decision Making/ A&P Clinical Course as of 08/14/22 0951  Tue Aug 14, 2022  0838 Patient reported feeling palpitations and telemetry strip was printed showing occasional PVCs. [VK]  [id number] Patients labs show normal TSH and electrolyte, no signs of anemia. She is stable for discharge home with primary care follow. She was recommended continued cessation of alcohol and caffeine.  [VK]    Clinical Course User Index [VK] Kemper Durie, DO                             Medical Decision Making This patient presents to the ED with chief complaint(s) of palpitations with no pertinent past medical history which further complicates the presenting complaint. The complaint involves an extensive differential diagnosis and also carries with it a high risk of complications and morbidity.    The differential diagnosis includes arrhythmia, anemia, electrolyte abnormality, thyroid dysfunction, pregnancy, substance use  Additional history obtained: Additional history obtained from N/A Records reviewed Care Everywhere/External Records  ED Course and Reassessment: On patient's arrival to the emergency department EKG was performed and she is in a normal sinus rhythm without ischemic changes.  The patient states that she is not currently having the palpitations at this time and will be monitored on telemetry monitoring.  The patient will have labs including TSH and pregnancy test to evaluate for possible causes of her palpitations and will be closely reassessed.  Independent labs interpretation:  The following labs were independently interpreted: within normal  range  Independent visualization of imaging: N/A  Consultation: - Consulted or discussed management/test interpretation w/ external professional: N/A  Consideration for admission or further workup: Patient has no emergent conditions requiring admission or further work-up at this time and is stable for discharge home with primary care follow-up  Social Determinants of health: N/A    Amount and/or Complexity of Data Reviewed Labs: ordered.          Final Clinical Impression(s) / ED Diagnoses Final diagnoses:  PVC (premature ventricular contraction)  Palpitations    Rx /  DC Orders ED Discharge Orders     None         Kemper Durie, [place] 08/14/22 [id number]

## 2022-08-29 ENCOUNTER — Ambulatory Visit: Payer: Medicaid Other | Admitting: Nurse Practitioner

## 2022-08-29 ENCOUNTER — Encounter: Payer: Self-pay | Admitting: Nurse Practitioner

## 2022-08-29 VITALS — BP 110/82 | HR 70 | Temp 97.8°F | Ht 58.5 in | Wt 139.8 lb

## 2022-08-29 DIAGNOSIS — R002 Palpitations: Secondary | ICD-10-CM

## 2022-08-29 DIAGNOSIS — R21 Rash and other nonspecific skin eruption: Secondary | ICD-10-CM | POA: Diagnosis not present

## 2022-08-29 MED ORDER — TRIAMCINOLONE ACETONIDE 0.1 % EX CREA: 1.0000 | TOPICAL_CREAM | Freq: Two times a day (BID) | CUTANEOUS | 0 refills | Status: AC

## 2022-08-29 MED ORDER — PROPRANOLOL HCL 10 MG PO TABS: 10.0000 mg | ORAL_TABLET | Freq: Three times a day (TID) | ORAL | 0 refills | Status: DC | PRN

## 2022-08-29 NOTE — Patient Instructions (Signed)
It was great to see you!  Start propranolol 1 tablet every 8 hours as needed for palpitations. Let me know if they start happening more frequently, or you have other symptoms with them.   Let's follow-up in 2-3 months, sooner if you have concerns.  If a referral was placed today, you will be contacted for an appointment. Please note that routine referrals can sometimes take up to 3-4 weeks to process. Please call our office if you haven't heard anything after this time frame.  Take care,  Vance Peper, NP

## 2022-08-29 NOTE — Progress Notes (Signed)
New Patient Visit  BP 110/82 (BP Location: Right Arm)   Pulse 70   Temp 97.8 F (36.6 C)   Ht 4' 10.5" (1.486 m)   Wt 139 lb 12.8 oz (63.4 kg)   LMP 08/20/2022   SpO2 98%   Breastfeeding No   BMI 28.72 kg/m    Subjective:    Patient ID: Cassandra Pacheco, female    DOB: 1995/08/30, 27 y.o.   MRN: [id number]  CC: Chief Complaint  Patient presents with   Establish Care    NP. Est. Care, concerns with heart palpitations that happen randomly    HPI: Cassandra Pacheco is a 27 y.o. female presents for new patient visit to establish care.  Introduced to Designer, jewellery role and practice setting.  All questions answered.  Discussed provider/patient relationship and expectations.  She has been having heart palpitations intermittently for the last 6 months. They tend to last about 3 days and tends to happen at night. It seems to be happening more frequently. She has cut back on caffeine. She denies shortness of breath. She will normally take deep breaths when this happens. She does have a history of anxiety, but this is well controlled, denies panic attacks. She went to the ER for this on 08/14/22 where she was noted to have PVCs on the heart monitor.   She also has noticed some areas of redness around her neck. At first she thought it was from her necklace. She stopped wearing it and it didn't improve. She states that it itches at times. She denies drainage.   Depression and Anxiety Screen done:     08/29/2022    2:00 PM  Depression screen PHQ 2/9  Decreased Interest 0  Down, Depressed, Hopeless 0  PHQ - 2 Score 0  Altered sleeping 0  Tired, decreased energy 0  Change in appetite 0  Feeling bad or failure about yourself  0  Trouble concentrating 0  Moving slowly or fidgety/restless 0  Suicidal thoughts 0  PHQ-9 Score 0  Difficult doing work/chores Not difficult at all      08/29/2022    2:01 PM  GAD 7 : Generalized Anxiety Score  Nervous, Anxious, on Edge 1   Control/stop worrying 0  Worry too much - different things 1  Trouble relaxing 1  Restless 1  Easily annoyed or irritable 0  Afraid - awful might happen 0  Total GAD 7 Score 4  Anxiety Difficulty Not difficult at all    Past Medical History:  Diagnosis Date   Palpitation     Past Surgical History:  Procedure Laterality Date   TUBAL LIGATION      Family History  Problem Relation Age of Onset   Healthy Mother    Hypertension Maternal Grandmother    Diabetes Maternal Grandmother      Social History   Tobacco Use   Smoking status: Former    Packs/day: .25    Types: Cigarettes   Smokeless tobacco: Never  Vaping Use   Vaping Use: Former  Substance Use Topics   Alcohol use: Yes    Comment: occasionally   Drug use: No    Current Outpatient Medications on File Prior to Visit  Medication Sig Dispense Refill   ibuprofen (ADVIL,MOTRIN) 600 MG tablet Take 1 tablet (600 mg total) by mouth every 6 (six) hours. (Patient not taking: Reported on 08/29/2022) 30 tablet 0   Prenatal Vit-Fe Phos-FA-Omega (VITAFOL GUMMIES) 3.33-0.333-34.8 MG CHEW Chew 3 each by mouth  daily.  (Patient not taking: Reported on 08/29/2022)  5   No current facility-administered medications on file prior to visit.     Review of Systems  Constitutional: Negative.   HENT: Negative.    Eyes: Negative.   Respiratory: Negative.    Cardiovascular:  Positive for palpitations. Negative for chest pain.  Gastrointestinal: Negative.   Genitourinary: Negative.   Musculoskeletal: Negative.   Skin:  Positive for rash (neck).  Neurological: Negative.   Psychiatric/Behavioral:  The patient is nervous/anxious.       Objective:    BP 110/82 (BP Location: Right Arm)   Pulse 70   Temp 97.8 F (36.6 C)   Ht 4' 10.5" (1.486 m)   Wt 139 lb 12.8 oz (63.4 kg)   LMP 08/20/2022   SpO2 98%   Breastfeeding No   BMI 28.72 kg/m   Wt Readings from Last 3 Encounters:  08/29/22 139 lb 12.8 oz (63.4 kg)  08/17/16 158  lb (71.7 kg)  05/11/14 105 lb (47.6 kg)    BP Readings from Last 3 Encounters:  08/29/22 110/82  08/14/22 108/80  03/31/22 115/81    Physical Exam Vitals and nursing note reviewed.  Constitutional:      General: She is not in acute distress.    Appearance: Normal appearance.  HENT:     Head: Normocephalic and atraumatic.     Right Ear: Tympanic membrane, ear canal and external ear normal.     Left Ear: Tympanic membrane, ear canal and external ear normal.  Eyes:     Conjunctiva/sclera: Conjunctivae normal.  Cardiovascular:     Rate and Rhythm: Normal rate and regular rhythm.     Pulses: Normal pulses.     Heart sounds: Normal heart sounds.  Pulmonary:     Effort: Pulmonary effort is normal.     Breath sounds: Normal breath sounds.  Abdominal:     Palpations: Abdomen is soft.     Tenderness: There is no abdominal tenderness.  Musculoskeletal:        General: Normal range of motion.     Cervical back: Normal range of motion and neck supple.     Right lower leg: No edema.     Left lower leg: No edema.  Lymphadenopathy:     Cervical: No cervical adenopathy.  Skin:    General: Skin is warm and dry.     Comments: Red, scaly patches on sides of neck  Neurological:     General: No focal deficit present.     Mental Status: She is alert and oriented to person, place, and time.     Coordination: Coordination normal.     Gait: Gait normal.  Psychiatric:        Mood and Affect: Mood normal.        Behavior: Behavior normal.        Thought Content: Thought content normal.        Judgment: Judgment normal.       Assessment & Plan:   Problem List Items Addressed This Visit       Musculoskeletal and Integument   Rash    Red, scaly areas on neck consistent with eczema vs psoriasis. Start triamcinolone cream twice a day.         Other   Palpitations - Primary    She went to the ER on 08/14/22 and noted to have occasional PVCs on the monitor. EKG showed normal sinus rhythm.  CMP, CBC, Mg, and TSH normal. Will have  her start propranolol 10mg  TID prn palpitations. Limit caffeine. Follow-up in 2-3 months.        Follow up plan: Return in about 3 months (around 11/29/2022) for CPE.

## 2022-08-31 ENCOUNTER — Encounter: Payer: Self-pay | Admitting: Nurse Practitioner

## 2022-08-31 DIAGNOSIS — R002 Palpitations: Secondary | ICD-10-CM | POA: Insufficient documentation

## 2022-08-31 DIAGNOSIS — R21 Rash and other nonspecific skin eruption: Secondary | ICD-10-CM | POA: Insufficient documentation

## 2022-08-31 NOTE — Assessment & Plan Note (Signed)
She went to the ER on 08/14/22 and noted to have occasional PVCs on the monitor. EKG showed normal sinus rhythm. CMP, CBC, Mg, and TSH normal. Will have her start propranolol 10mg  TID prn palpitations. Limit caffeine. Follow-up in 2-3 months.

## 2022-08-31 NOTE — Assessment & Plan Note (Signed)
Red, scaly areas on neck consistent with eczema vs psoriasis. Start triamcinolone cream twice a day.

## 2022-09-11 ENCOUNTER — Telehealth: Payer: Self-pay | Admitting: Nurse Practitioner

## 2022-09-11 NOTE — Telephone Encounter (Signed)
NT called 3 times and did not get an answer. Pt called back and I called again and made another chart. Pt stated she is in an area with bad signal. Will you try calling her to see if she needs an appt or not.

## 2022-09-11 NOTE — Telephone Encounter (Signed)
LVM for patient to return call. 

## 2022-09-11 NOTE — Telephone Encounter (Signed)
Noted  

## 2022-09-11 NOTE — Telephone Encounter (Signed)
Caller Name: Cassandra Pacheco Ph #: [phone] Chief Complaint: heart palpitations and dizziness   This call was transferred to Nurse Triage/Access Nurse. This is for documentation purposes. No follow up required at this time.

## 2022-09-11 NOTE — Telephone Encounter (Signed)
LVM to return call.

## 2022-09-11 NOTE — Telephone Encounter (Signed)
I spoke with patient and she said that the palpitations are becoming more frequently. She had one on the 3/26,30, 4/01,02 and she is taking the propanolol. I scheduled patient for 4/04 at 11am to be seen in the office.

## 2022-09-12 NOTE — Progress Notes (Unsigned)
   Established Patient Office Visit  Subjective   Patient ID: Cassandra Pacheco, female    DOB: 30-Dec-1995  Age: 27 y.o. MRN: [id number]  No chief complaint on file.   HPI  Cassandra Pacheco is here to follow-up on palpitations. She states that they are getting more frequent. She was given propranolol 10mg  TID prn palpitations last visit.   {History (Optional):23778}  ROS    Objective:     LMP 08/20/2022  {Vitals History (Optional):23777}  Physical Exam   No results found for any visits on 09/13/22.  {Labs (Optional):23779}  The ASCVD Risk score (Arnett DK, et al., 2019) failed to calculate for the following reasons:   The 2019 ASCVD risk score is only valid for ages 22 to 30    Assessment & Plan:   Problem List Items Addressed This Visit   None   No follow-ups on file.    Charyl Dancer, NP

## 2022-09-13 ENCOUNTER — Encounter: Payer: Self-pay | Admitting: Nurse Practitioner

## 2022-09-13 ENCOUNTER — Other Ambulatory Visit: Payer: Self-pay | Admitting: Nurse Practitioner

## 2022-09-13 ENCOUNTER — Ambulatory Visit: Payer: Medicaid Other | Admitting: Nurse Practitioner

## 2022-09-13 VITALS — BP 116/84 | HR 81 | Temp 98.0°F | Ht 58.5 in | Wt 143.0 lb

## 2022-09-13 DIAGNOSIS — R002 Palpitations: Secondary | ICD-10-CM | POA: Diagnosis not present

## 2022-09-13 MED ORDER — METOPROLOL SUCCINATE ER 25 MG PO TB24: 12.5000 mg | ORAL_TABLET | Freq: Every day | ORAL | 0 refills | Status: DC

## 2022-09-13 NOTE — Assessment & Plan Note (Signed)
Palpitations are becoming more frequent over the last week.  Continue drinking plenty of fluids and not drinking caffeine.  Will have her stop propranolol since this is not helping and start Toprol-XL 12.5 mg daily.  Referral placed to cardiology with worsening symptoms.

## 2022-09-13 NOTE — Patient Instructions (Addendum)
It was great to see you!  I have placed a referral to cardiology for your palpitations.   Start metoprolol 1/2 tablet daily for your palpitations.   Let's follow-up at your next scheduled appointment.   Take care,  Vance Peper, NP

## 2022-10-29 NOTE — Progress Notes (Unsigned)
  Cardiology Office Note:   Date:  10/31/2022  ID:  Cassandra Pacheco, DOB [birth year], MRN [id number]  History of Present Illness:   Cassandra Pacheco is a 27 y.o. female with no significant medical history who was referred by Rodman Pickle for further evaluation of palpitations.   Patient seen by Ms. McElwee on 09/2022. Notes reviewed. Patient reported episodes of palpitations which were increasing in frequency prompting referral to Cardiology for further evaluation.  Today, the patient states that she has been having palpitations that initially began while pregnant. After delivery, palpitations improved but they have since recurred and have been ongoing for about a year. Episodes are occurring about 1-3x/month (has had months where they are more frequent) and can last all day when they occur. Feels like her heart is skipping beats at the time. No associated SOB, chest pain, LE edema, orthopnea or PND. Had one episode of lightheadedness with the palpitations but no syncope.   TSH normal. HgB normal.  No known family history of arrhythmias or CAD.   Past Medical History:  Diagnosis Date   Palpitation      ROS: As per HPI  Studies Reviewed:    EKG:  NSR, HR 66-personally reviewed       Risk Assessment/Calculations:              Physical Exam:   VS:  BP 108/74   Pulse 66   Ht 4' 10.5" (1.486 m)   Wt 144 lb 3.2 oz (65.4 kg)   BMI 29.62 kg/m    Wt Readings from Last 3 Encounters:  10/31/22 144 lb 3.2 oz (65.4 kg)  09/13/22 143 lb (64.9 kg)  08/29/22 139 lb 12.8 oz (63.4 kg)     GEN: Well nourished, well developed in no acute distress NECK: No JVD; No carotid bruits CARDIAC: RRR, no murmurs, rubs, gallops RESPIRATORY:  Clear to auscultation without rales, wheezing or rhonchi  ABDOMEN: Soft, non-tender, non-distended EXTREMITIES:  No edema; No deformity   ASSESSMENT AND PLAN:   #Palpitations: -Check 14 day zio monitor -Increase hydration with electrolyte  replacement -Cut back on caffeine -No supplement use -TSH normal; patient not anemic        Signed, Meriam Sprague, MD

## 2022-10-31 ENCOUNTER — Ambulatory Visit: Payer: Medicaid Other | Attending: Cardiology | Admitting: Cardiology

## 2022-10-31 ENCOUNTER — Telehealth: Payer: Self-pay | Admitting: *Deleted

## 2022-10-31 ENCOUNTER — Encounter: Payer: Self-pay | Admitting: Cardiology

## 2022-10-31 ENCOUNTER — Ambulatory Visit (INDEPENDENT_AMBULATORY_CARE_PROVIDER_SITE_OTHER): Payer: Medicaid Other

## 2022-10-31 VITALS — BP 108/74 | HR 66 | Ht 58.5 in | Wt 144.2 lb

## 2022-10-31 DIAGNOSIS — R002 Palpitations: Secondary | ICD-10-CM

## 2022-10-31 NOTE — Progress Notes (Unsigned)
Enrolled patient for a 14 day Zio XT  monitor to be mailed to patients home  °

## 2022-10-31 NOTE — Telephone Encounter (Signed)
-----   Message from Flavia Shipper sent at 10/31/2022  9:23 AM EDT ----- Regarding: RE: 2 WEEK ZIO PER DR. Shari Prows Done ----- Message ----- From: Loa Socks, LPN Sent: 1/61/0960   9:01 AM EDT To: Ernst Bowler; Katrina Claria Dice Subject: 2 WEEK ZIO PER DR. Shari Prows                   2 week zio for palpitations  Please enroll and let me know  Thanks Lajoyce Corners

## 2022-10-31 NOTE — Patient Instructions (Signed)
Medication Instructions:   Your physician recommends that you continue on your current medications as directed. Please refer to the Current Medication list given to you today.  *If you need a refill on your cardiac medications before your next appointment, please call your pharmacy*   Testing/Procedures:  ZIO XT- Long Term Monitor Instructions  Your physician has requested you wear a ZIO patch monitor for 14 days.  This is a single patch monitor. [employer] supplies one patch monitor per enrollment. Additional stickers are not available. Please do not apply patch if you will be having a Nuclear Stress Test,  Echocardiogram, Cardiac CT, MRI, or Chest Xray during the period you would be wearing the  monitor. The patch cannot be worn during these tests. You cannot remove and re-apply the  ZIO XT patch monitor.  Your ZIO patch monitor will be mailed 3 day USPS to your address on file. It may take 3-5 days  to receive your monitor after you have been enrolled.  Once you have received your monitor, please review the enclosed instructions. Your monitor  has already been registered assigning a specific monitor serial # to you.  Billing and Patient Assistance Program Information  We have supplied [employer] with any of your insurance information on file for billing purposes. [employer] offers a sliding scale Patient Assistance Program for patients that do not have  insurance, or whose insurance does not completely cover the cost of the ZIO monitor.  You must apply for the Patient Assistance Program to qualify for this discounted rate.  To apply, please call [employer] at [phone], select option 4, select option 2, ask to apply for  Patient Assistance Program. Meredeth Ide will ask your household income, and how many people  are in your household. They will quote your out-of-pocket cost based on that information.  [employer] will also be able to set up a 39-month, interest-free payment plan if  needed.  Applying the monitor   Shave hair from upper left chest.  Hold abrader disc by orange tab. Rub abrader in 40 strokes over the upper left chest as  indicated in your monitor instructions.  Clean area with 4 enclosed alcohol pads. Let dry.  Apply patch as indicated in monitor instructions. Patch will be placed under collarbone on left  side of chest with arrow pointing upward.  Rub patch adhesive wings for 2 minutes. Remove white label marked "1". Remove the white  label marked "2". Rub patch adhesive wings for 2 additional minutes.  While looking in a mirror, press and release button in center of patch. A small green light will  flash 3-4 times. This will be your only indicator that the monitor has been turned on.  Do not shower for the first 24 hours. You may shower after the first 24 hours.  Press the button if you feel a symptom. You will hear a small click. Record Date, Time and  Symptom in the Patient Logbook.  When you are ready to remove the patch, follow instructions on the last 2 pages of Patient  Logbook. Stick patch monitor onto the last page of Patient Logbook.  Place Patient Logbook in the blue and white box. Use locking tab on box and tape box closed  securely. The blue and white box has prepaid postage on it. Please place it in the mailbox as  soon as possible. Your physician should have your test results approximately 7 days after the  monitor has been mailed back to [facility].  Call [employer]  Customer Care at [phone] if you have questions regarding  your ZIO XT patch monitor. Call them immediately if you see an orange light blinking on your  monitor.  If your monitor falls off in less than 4 days, contact our Monitor department at [phone].  If your monitor becomes loose or falls off after 4 days call [employer] at [phone] for  suggestions on securing your monitor    Follow-Up:  AS NEEDED WITH OUR OFFICE

## 2022-11-02 DIAGNOSIS — R002 Palpitations: Secondary | ICD-10-CM

## 2022-11-18 ENCOUNTER — Other Ambulatory Visit: Payer: Self-pay | Admitting: Nurse Practitioner

## 2022-11-19 ENCOUNTER — Other Ambulatory Visit: Payer: Self-pay | Admitting: Nurse Practitioner

## 2022-11-19 NOTE — Telephone Encounter (Signed)
Requesting: METOPROLOL ER SUCCINATE 25MG  TABS  Last Visit: 09/13/2022 Next Visit: 11/28/2022 Last Refill: 09/13/2022  Please Advise

## 2022-11-20 NOTE — Telephone Encounter (Signed)
Requesting: METOPROLOL ER SUCCINATE 25MG  TABS  Last Visit: 09/13/2022 Next Visit: 11/28/2022 Last Refill: 11/19/2022  Please Advise   Patient requesting a 90 day supply

## 2022-11-28 ENCOUNTER — Encounter: Payer: Medicaid Other | Admitting: Nurse Practitioner
